# Patient Record
Sex: Male | Born: 1980 | Race: White | Hispanic: Yes | Smoking: Current some day smoker
Health system: Southern US, Community
[De-identification: ages and names within clinical notes are randomized; demographics above are authoritative.]

## PROBLEM LIST (undated history)

## (undated) DIAGNOSIS — F32A Depression, unspecified: Secondary | ICD-10-CM

---

## 2020-06-27 ENCOUNTER — Emergency Department (HOSPITAL_COMMUNITY)

## 2020-06-27 ENCOUNTER — Emergency Department (HOSPITAL_COMMUNITY)
Admission: EM | Admit: 2020-06-27 | Discharge: 2020-06-27 | Disposition: A | Discharge status: Home / Self Care | Attending: Emergency Medicine | Admitting: Emergency Medicine

## 2020-06-27 ENCOUNTER — Encounter (HOSPITAL_COMMUNITY): Payer: Self-pay | Admitting: Obstetrics and Gynecology

## 2020-06-27 ENCOUNTER — Other Ambulatory Visit: Payer: Self-pay

## 2020-06-27 DIAGNOSIS — F17200 Nicotine dependence, unspecified, uncomplicated: Secondary | ICD-10-CM | POA: Insufficient documentation

## 2020-06-27 DIAGNOSIS — R519 Headache, unspecified: Secondary | ICD-10-CM | POA: Insufficient documentation

## 2020-06-27 DIAGNOSIS — R945 Abnormal results of liver function studies: Secondary | ICD-10-CM | POA: Insufficient documentation

## 2020-06-27 DIAGNOSIS — R7989 Other specified abnormal findings of blood chemistry: Secondary | ICD-10-CM

## 2020-06-27 DIAGNOSIS — M25531 Pain in right wrist: Secondary | ICD-10-CM | POA: Diagnosis not present

## 2020-06-27 DIAGNOSIS — F10120 Alcohol abuse with intoxication, uncomplicated: Secondary | ICD-10-CM | POA: Diagnosis not present

## 2020-06-27 DIAGNOSIS — Y905 Blood alcohol level of 100-119 mg/100 ml: Secondary | ICD-10-CM | POA: Insufficient documentation

## 2020-06-27 DIAGNOSIS — G939 Disorder of brain, unspecified: Secondary | ICD-10-CM

## 2020-06-27 DIAGNOSIS — M25511 Pain in right shoulder: Secondary | ICD-10-CM | POA: Diagnosis not present

## 2020-06-27 DIAGNOSIS — F10920 Alcohol use, unspecified with intoxication, uncomplicated: Secondary | ICD-10-CM

## 2020-06-27 HISTORY — DX: Depression, unspecified: F32.A

## 2020-06-27 LAB — CBC
HCT: 46 % (ref 39.0–52.0)
Hemoglobin: 14.9 g/dL (ref 13.0–17.0)
MCH: 31.7 pg (ref 26.0–34.0)
MCHC: 32.4 g/dL (ref 30.0–36.0)
MCV: 97.9 fL (ref 80.0–100.0)
Platelets: 416 10*3/uL — ABNORMAL HIGH (ref 150–400)
RBC: 4.7 MIL/uL (ref 4.22–5.81)
RDW: 12.1 % (ref 11.5–15.5)
WBC: 10.5 10*3/uL (ref 4.0–10.5)
nRBC: 0 % (ref 0.0–0.2)

## 2020-06-27 LAB — COMPREHENSIVE METABOLIC PANEL
ALT: 77 U/L — ABNORMAL HIGH (ref 0–44)
AST: 45 U/L — ABNORMAL HIGH (ref 15–41)
Albumin: 4.8 g/dL (ref 3.5–5.0)
Alkaline Phosphatase: 97 U/L (ref 38–126)
Anion gap: 9 (ref 5–15)
BUN: 9 mg/dL (ref 6–20)
CO2: 27 mmol/L (ref 22–32)
Calcium: 9.4 mg/dL (ref 8.9–10.3)
Chloride: 106 mmol/L (ref 98–111)
Creatinine, Ser: 0.71 mg/dL (ref 0.61–1.24)
GFR, Estimated: >60 mL/min (ref >60)
Glucose, Bld: 101 mg/dL — ABNORMAL HIGH (ref 70–99)
Potassium: 4 mmol/L (ref 3.5–5.1)
Sodium: 142 mmol/L (ref 135–145)
Total Bilirubin: 0.4 mg/dL (ref 0.3–1.2)
Total Protein: 8.5 g/dL — ABNORMAL HIGH (ref 6.5–8.1)

## 2020-06-27 LAB — ETHANOL: Alcohol, Ethyl (B): 114 mg/dL — ABNORMAL HIGH (ref <10)

## 2020-06-27 MED ORDER — ACETAMINOPHEN 325 MG PO TABS
650.0000 mg | ORAL_TABLET | Freq: Once | ORAL | Status: AC
  Administered 2020-06-27: 650 mg via ORAL
  Filled 2020-06-27: qty 2

## 2020-06-27 MED ORDER — GADOBUTROL 1 MMOL/ML IV SOLN
7.0000 mL | Freq: Once | INTRAVENOUS | Status: AC | PRN
  Administered 2020-06-27: 7 mL via INTRAVENOUS
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
## 2020-06-27 NOTE — ED Notes (Signed)
Pt refusing blood draw.
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
## 2020-06-27 NOTE — Discharge Instructions (Addendum)
Tiene una lesion en el cranio. No es peligroso.  Da Neomia Dear cita con el doctor de cranio en la clinica.  El dolor es de los musculos. Toma tylenol e ibuprofen.  Los numeros de higado son un poco alto- porque el alcohol. Deja de tomar alcohol.  Regresa a la sala de emergencia si tiene problemas nuevas.

## 2020-06-27 NOTE — ED Notes (Signed)
Patient refused COVID swab
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
